# Patient Record
Sex: Female | Born: 1990 | Race: White | Hispanic: No | Marital: Single | Smoking: Never smoker
Health system: Southern US, Community
[De-identification: ages and names within clinical notes are randomized; demographics above are authoritative.]

## PROBLEM LIST (undated history)

## (undated) DIAGNOSIS — S060XAA Concussion with loss of consciousness status unknown, initial encounter: Secondary | ICD-10-CM

## (undated) DIAGNOSIS — S060X9A Concussion with loss of consciousness of unspecified duration, initial encounter: Secondary | ICD-10-CM

## (undated) HISTORY — PX: MANDIBLE SURGERY: SHX707

## (undated) HISTORY — PX: TONSILLECTOMY: SUR1361

## (undated) HISTORY — PX: CHEST TUBE INSERTION: SHX231

---

## 2014-11-06 ENCOUNTER — Encounter (HOSPITAL_COMMUNITY): Payer: Self-pay

## 2014-11-06 ENCOUNTER — Emergency Department (HOSPITAL_COMMUNITY): Payer: No Typology Code available for payment source

## 2014-11-06 ENCOUNTER — Emergency Department (HOSPITAL_COMMUNITY)
Admission: EM | Admit: 2014-11-06 | Discharge: 2014-11-06 | Disposition: A | Discharge status: Home / Self Care | Payer: No Typology Code available for payment source | Attending: Emergency Medicine | Admitting: Emergency Medicine

## 2014-11-06 DIAGNOSIS — S3992XA Unspecified injury of lower back, initial encounter: Secondary | ICD-10-CM | POA: Diagnosis not present

## 2014-11-06 DIAGNOSIS — S40012A Contusion of left shoulder, initial encounter: Secondary | ICD-10-CM | POA: Diagnosis not present

## 2014-11-06 DIAGNOSIS — S3991XA Unspecified injury of abdomen, initial encounter: Secondary | ICD-10-CM | POA: Diagnosis not present

## 2014-11-06 DIAGNOSIS — T148XXA Other injury of unspecified body region, initial encounter: Secondary | ICD-10-CM

## 2014-11-06 DIAGNOSIS — Z79899 Other long term (current) drug therapy: Secondary | ICD-10-CM | POA: Insufficient documentation

## 2014-11-06 DIAGNOSIS — S299XXA Unspecified injury of thorax, initial encounter: Secondary | ICD-10-CM | POA: Insufficient documentation

## 2014-11-06 DIAGNOSIS — S0990XA Unspecified injury of head, initial encounter: Secondary | ICD-10-CM | POA: Diagnosis not present

## 2014-11-06 DIAGNOSIS — Y998 Other external cause status: Secondary | ICD-10-CM | POA: Diagnosis not present

## 2014-11-06 DIAGNOSIS — Z7982 Long term (current) use of aspirin: Secondary | ICD-10-CM | POA: Diagnosis not present

## 2014-11-06 DIAGNOSIS — S199XXA Unspecified injury of neck, initial encounter: Secondary | ICD-10-CM | POA: Diagnosis not present

## 2014-11-06 DIAGNOSIS — Y92410 Unspecified street and highway as the place of occurrence of the external cause: Secondary | ICD-10-CM | POA: Diagnosis not present

## 2014-11-06 DIAGNOSIS — Y9389 Activity, other specified: Secondary | ICD-10-CM | POA: Insufficient documentation

## 2014-11-06 DIAGNOSIS — T07XXXA Unspecified multiple injuries, initial encounter: Secondary | ICD-10-CM

## 2014-11-06 LAB — I-STAT BETA HCG BLOOD, ED (MC, WL, AP ONLY)

## 2014-11-06 MED ORDER — MORPHINE SULFATE 4 MG/ML IJ SOLN
4.0000 mg | Freq: Once | INTRAMUSCULAR | Status: AC
  Administered 2014-11-06: 4 mg via INTRAVENOUS
  Filled 2014-11-06: qty 1

## 2014-11-06 MED ORDER — ONDANSETRON HCL 4 MG/2ML IJ SOLN
4.0000 mg | Freq: Once | INTRAMUSCULAR | Status: AC
  Administered 2014-11-06: 4 mg via INTRAVENOUS
  Filled 2014-11-06: qty 2

## 2014-11-06 MED ORDER — HYDROCODONE-ACETAMINOPHEN 5-325 MG PO TABS: 2.0000 | ORAL_TABLET | ORAL | Status: DC | PRN

## 2014-11-06 MED ORDER — NAPROXEN 500 MG PO TABS: 500.0000 mg | ORAL_TABLET | Freq: Two times a day (BID) | ORAL | Status: DC

## 2014-11-06 MED ORDER — CYCLOBENZAPRINE HCL 10 MG PO TABS: 10.0000 mg | ORAL_TABLET | Freq: Two times a day (BID) | ORAL | Status: DC | PRN

## 2014-11-06 MED ORDER — HYDROCODONE-ACETAMINOPHEN 5-325 MG PO TABS
2.0000 | ORAL_TABLET | Freq: Once | ORAL | Status: AC
  Administered 2014-11-06: 2 via ORAL
  Filled 2014-11-06: qty 2

## 2014-11-06 MED ORDER — IOHEXOL 300 MG/ML  SOLN
80.0000 mL | Freq: Once | INTRAMUSCULAR | Status: AC | PRN
  Administered 2014-11-06: 80 mL via INTRAVENOUS
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
## 2014-11-06 NOTE — Discharge Instructions (Signed)
Your x-rays show no signs of significant injury, you will need to have your chest CAT scan reevaluated in the next several months to evaluate the thickness of the pericardium, please see the written report, I have also given you a copy of the CAT scans on CD. See her doctor within 2 days to arrange follow-up for a repeat CT scan.  Please call your doctor for a followup appointment within 24-48 hours. When you talk to your doctor please let them know that you were seen in the emergency department and have them acquire all of your records so that they can discuss the findings with you and formulate a treatment plan to fully care for your new and ongoing problems.

## 2014-11-06 NOTE — ED Notes (Signed)
Pt. Able to ambulate independently with stand by assist from bathroom to pt. Room.
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
## 2018-09-21 ENCOUNTER — Emergency Department (HOSPITAL_BASED_OUTPATIENT_CLINIC_OR_DEPARTMENT_OTHER)
Admission: EM | Admit: 2018-09-21 | Discharge: 2018-09-21 | Disposition: A | Discharge status: Home / Self Care | Payer: BLUE CROSS/BLUE SHIELD | Attending: Emergency Medicine | Admitting: Emergency Medicine

## 2018-09-21 ENCOUNTER — Other Ambulatory Visit: Payer: Self-pay

## 2018-09-21 ENCOUNTER — Encounter (HOSPITAL_BASED_OUTPATIENT_CLINIC_OR_DEPARTMENT_OTHER): Payer: Self-pay | Admitting: Emergency Medicine

## 2018-09-21 DIAGNOSIS — Y929 Unspecified place or not applicable: Secondary | ICD-10-CM | POA: Insufficient documentation

## 2018-09-21 DIAGNOSIS — S060X0A Concussion without loss of consciousness, initial encounter: Secondary | ICD-10-CM | POA: Insufficient documentation

## 2018-09-21 DIAGNOSIS — Y939 Activity, unspecified: Secondary | ICD-10-CM | POA: Insufficient documentation

## 2018-09-21 DIAGNOSIS — S0990XA Unspecified injury of head, initial encounter: Secondary | ICD-10-CM | POA: Diagnosis present

## 2018-09-21 DIAGNOSIS — W228XXA Striking against or struck by other objects, initial encounter: Secondary | ICD-10-CM | POA: Diagnosis not present

## 2018-09-21 DIAGNOSIS — W08XXXA Fall from other furniture, initial encounter: Secondary | ICD-10-CM | POA: Insufficient documentation

## 2018-09-21 DIAGNOSIS — Y999 Unspecified external cause status: Secondary | ICD-10-CM | POA: Insufficient documentation

## 2018-09-21 HISTORY — DX: Concussion with loss of consciousness status unknown, initial encounter: S06.0XAA

## 2018-09-21 HISTORY — DX: Concussion with loss of consciousness of unspecified duration, initial encounter: S06.0X9A

## 2018-09-21 NOTE — ED Triage Notes (Signed)
Fell from sitting on couch 4 days ago and hit right mastoid area on the corner of a coffee table.  Skin is intact.  Pt sts it was swollen.  Having HA, nausea, dizziness, photo-sensitivity, and pain shooting down  Neck.  Sts she has had 4 concussions in the past.

## 2018-09-21 NOTE — ED Provider Notes (Signed)
[facility] EMERGENCY DEPARTMENT Provider Note   CSN: [id number] Arrival date & time: 09/21/18  [id number]     History   Chief Complaint Chief Complaint  Patient presents with  . Head Injury    HPI Lauren Bautista is a 27 y.o. female.  The history is provided by the patient. No language interpreter was used.  Head Injury   Incident onset: 4 days ago. She came to the ER via walk-in. The injury mechanism was a direct blow. There was no loss of consciousness. There was no blood loss. The quality of the pain is described as throbbing. The pain is moderate. The pain has been constant since the injury. Pertinent negatives include no numbness, no blurred vision, no vomiting and no weakness. She has tried nothing for the symptoms. The treatment provided no relief.   Pt reports she fell off of a sofa a hit her head.  Pt thinks she has a concussion  Past Medical History:  Diagnosis Date  . Concussion     There are no active problems to display for this patient.   Past Surgical History:  Procedure Laterality Date  . CHEST TUBE INSERTION    . MANDIBLE SURGERY    . TONSILLECTOMY       OB History   None      Home Medications    Prior to Admission medications   Medication Sig Start Date End Date Taking? Authorizing Provider  Aspirin-Acetaminophen-Caffeine (EXCEDRIN MIGRAINE PO) Take 2 tablets by mouth daily as needed (migraine).    [provider]    Family History No family history on file.  Social History Social History   Tobacco Use  . Smoking status: Never Smoker  . Smokeless tobacco: Never Used  Substance Use Topics  . Alcohol use: Yes    Comment: socially  . Drug use: Yes    Types: Marijuana     Allergies   Bee pollen   Review of Systems Review of Systems  Eyes: Negative for blurred vision.  Gastrointestinal: Negative for vomiting.  Neurological: Negative for weakness and numbness.  All other systems reviewed and are  negative.    Physical Exam Updated Vital Signs BP (!) 147/83 (BP Location: Left Arm)   Pulse 80   Temp 98 F (36.7 C) (Oral)   Resp 16   Ht 5\' 7"  (1.702 m)   Wt 81.6 kg   LMP 08/22/2018   SpO2 96%   BMI 28.19 kg/m   Physical Exam  Constitutional: She is oriented to person, place, and time. She appears well-developed and well-nourished.  HENT:  Head: Normocephalic.  Eyes: EOM are normal.  Neck: Normal range of motion.  Cardiovascular: Normal rate, regular rhythm and normal heart sounds.  Pulmonary/Chest: Effort normal.  Abdominal: Soft. She exhibits no distension.  Musculoskeletal: Normal range of motion.  Neurological: She is alert and oriented to person, place, and time.  Skin: Skin is warm.  Psychiatric: She has a normal mood and affect.  Nursing note and vitals reviewed.    ED Treatments / Results  Labs (all labs ordered are listed, but only abnormal results are displayed) Labs Reviewed - No data to display  EKG None  Radiology No results found.  Procedures Procedures (including critical care time)  Medications Ordered in ED Medications - No data to display   Initial Impression / Assessment and Plan / ED Course  I have reviewed the triage vital signs and the nursing notes.  Pertinent labs & imaging results that  were available during my care of the patient were reviewed by me and considered in my medical decision making (see chart for details).    Pt looks good,  No evidence of skull fracture, no bruising.  Pt may have a mild concussion.  Pt advised to continue tylenol.  Pt given note for out of work x 3 days.    Final Clinical Impressions(s) / ED Diagnoses   Final diagnoses:  Concussion without loss of consciousness, initial encounter    ED Discharge Orders    None    An After Visit Summary was printed and given to the patient.    Elson Areas, [place] 09/21/18 1043    Vanetta Mulders, MD 09/25/18 [phone]

## 2018-09-21 NOTE — Discharge Instructions (Signed)
Follow up with your Physician for recheck in 1 week  

## 2018-09-21 NOTE — ED Notes (Signed)
Patient left at this time with all belongings. 

## 2018-12-04 ENCOUNTER — Emergency Department (HOSPITAL_BASED_OUTPATIENT_CLINIC_OR_DEPARTMENT_OTHER): Payer: No Typology Code available for payment source

## 2018-12-04 ENCOUNTER — Encounter (HOSPITAL_BASED_OUTPATIENT_CLINIC_OR_DEPARTMENT_OTHER): Payer: Self-pay | Admitting: Emergency Medicine

## 2018-12-04 ENCOUNTER — Other Ambulatory Visit: Payer: Self-pay

## 2018-12-04 ENCOUNTER — Emergency Department (HOSPITAL_BASED_OUTPATIENT_CLINIC_OR_DEPARTMENT_OTHER)
Admission: EM | Admit: 2018-12-04 | Discharge: 2018-12-04 | Disposition: A | Discharge status: Home / Self Care | Payer: No Typology Code available for payment source | Attending: Emergency Medicine | Admitting: Emergency Medicine

## 2018-12-04 DIAGNOSIS — S8990XA Unspecified injury of unspecified lower leg, initial encounter: Secondary | ICD-10-CM | POA: Diagnosis present

## 2018-12-04 DIAGNOSIS — Z79899 Other long term (current) drug therapy: Secondary | ICD-10-CM | POA: Insufficient documentation

## 2018-12-04 DIAGNOSIS — Y990 Civilian activity done for income or pay: Secondary | ICD-10-CM | POA: Diagnosis not present

## 2018-12-04 DIAGNOSIS — S8002XA Contusion of left knee, initial encounter: Secondary | ICD-10-CM | POA: Insufficient documentation

## 2018-12-04 DIAGNOSIS — Y939 Activity, unspecified: Secondary | ICD-10-CM | POA: Insufficient documentation

## 2018-12-04 DIAGNOSIS — S8000XA Contusion of unspecified knee, initial encounter: Secondary | ICD-10-CM

## 2018-12-04 DIAGNOSIS — S8001XA Contusion of right knee, initial encounter: Secondary | ICD-10-CM | POA: Diagnosis not present

## 2018-12-04 DIAGNOSIS — Y929 Unspecified place or not applicable: Secondary | ICD-10-CM | POA: Diagnosis not present

## 2018-12-04 DIAGNOSIS — W010XXA Fall on same level from slipping, tripping and stumbling without subsequent striking against object, initial encounter: Secondary | ICD-10-CM | POA: Insufficient documentation

## 2018-12-04 NOTE — ED Triage Notes (Signed)
Pt states she slipped on some water on the floor and fell at work  Pt is c/o pain to both her legs

## 2018-12-04 NOTE — Discharge Instructions (Addendum)
Ice for 20 minutes every 2 hours while awake for the next 2 days.  Ibuprofen 600 mg every 6 hours as needed for pain.  Follow-up with your primary doctor if symptoms or not improving in the next week.

## 2018-12-04 NOTE — ED Provider Notes (Signed)
[facility] EMERGENCY DEPARTMENT Provider Note   CSN: [id number] Arrival date & time: 12/04/18  0116     History   Chief Complaint Chief Complaint  Patient presents with  . Fall    HPI Lauren Bautista is a 28 y.o. female.  Patient is a 28 year old female with no significant past medical history.  She presents today for evaluation of fall.  She was at work this evening when she slipped on a wet floor and landed on both knees.  She is having pain in both knees.  There is no other injury.  The history is provided by the patient.  Fall  This is a new problem. The current episode started 1 to 2 hours ago. The problem occurs constantly. The problem has not changed since onset.The symptoms are aggravated by bending (Walking and palpation). Nothing relieves the symptoms. She has tried nothing for the symptoms.    Past Medical History:  Diagnosis Date  . Concussion     There are no active problems to display for this patient.   Past Surgical History:  Procedure Laterality Date  . CHEST TUBE INSERTION    . MANDIBLE SURGERY    . TONSILLECTOMY       OB History   No obstetric history on file.      Home Medications    Prior to Admission medications   Medication Sig Start Date End Date Taking? Authorizing Provider  Aspirin-Acetaminophen-Caffeine (EXCEDRIN MIGRAINE PO) Take 2 tablets by mouth daily as needed (migraine).    [provider]    Family History History reviewed. No pertinent family history.  Social History Social History   Tobacco Use  . Smoking status: Never Smoker  . Smokeless tobacco: Never Used  Substance Use Topics  . Alcohol use: Yes    Comment: socially  . Drug use: Yes    Comment: CBD     Allergies   Bee pollen   Review of Systems Review of Systems  All other systems reviewed and are negative.    Physical Exam Updated Vital Signs BP (!) 138/93 (BP Location: Left Arm)   Pulse 73   Temp 98.3 F (36.8 C) (Oral)    Resp 18   Ht 5\' 8"  (1.727 m)   Wt 81.6 kg   LMP 11/19/2018 (Exact Date)   SpO2 100%   BMI 27.37 kg/m   Physical Exam Vitals signs and nursing note reviewed.  Constitutional:      Appearance: Normal appearance.  HENT:     Head: Normocephalic and atraumatic.  Pulmonary:     Effort: Pulmonary effort is normal.  Musculoskeletal:     Comments: Both knees appear grossly normal.  There is mild swelling over the anterior aspect just inferior to the patella.  She has pain with range of motion, however knees appear stable otherwise with good range of motion and no laxity with varus or valgus stress.  Anterior and posterior drawer test negative bilaterally.  Skin:    General: Skin is warm and dry.  Neurological:     Mental Status: She is alert.      ED Treatments / Results  Labs (all labs ordered are listed, but only abnormal results are displayed) Labs Reviewed - No data to display  EKG None  Radiology No results found.  Procedures Procedures (including critical care time)  Medications Ordered in ED Medications - No data to display   Initial Impression / Assessment and Plan / ED Course  I have reviewed the  triage vital signs and the nursing notes.  Pertinent labs & imaging results that were available during my care of the patient were reviewed by me and considered in my medical decision making (see chart for details).  Patient presenting with bilateral knee pain after a fall at work on a wet floor.  She has some bruising and mild swelling to the anterior aspect of the knees just inferior to the patella, however there is no instability and x-rays are negative.  This will be treated as a sprain/contusion with anti-inflammatories, rest, and ice.  She is to follow-up with primary doctor if not improving in the next week.  Final Clinical Impressions(s) / ED Diagnoses   Final diagnoses:  None    ED Discharge Orders    None       Geoffery Lyons, MD 12/04/18 [phone]

## 2018-12-04 NOTE — ED Notes (Signed)
Pt was given ice packs for her bilateral knees

## 2018-12-04 NOTE — ED Notes (Signed)
Pt understood dc material. NAD noted. Pt ambulatory. All questions answered to satisfaction.

## 2019-08-08 ENCOUNTER — Other Ambulatory Visit: Payer: Self-pay

## 2019-08-08 DIAGNOSIS — Z20822 Contact with and (suspected) exposure to covid-19: Secondary | ICD-10-CM

## 2019-08-10 LAB — NOVEL CORONAVIRUS, NAA: SARS-CoV-2, NAA: NOT DETECTED
